# Patient Record
Sex: Male | Born: 1995 | Race: Black or African American | Hispanic: No | Marital: Single | Smoking: Never smoker
Health system: Southern US, Community
[De-identification: ages and names within clinical notes are randomized; demographics above are authoritative.]

## PROBLEM LIST (undated history)

## (undated) HISTORY — PX: HERNIA REPAIR: SHX51

---

## 2019-07-13 ENCOUNTER — Ambulatory Visit
Admission: EM | Admit: 2019-07-13 | Discharge: 2019-07-13 | Disposition: A | Discharge status: Home / Self Care | Payer: Self-pay | Attending: Emergency Medicine | Admitting: Emergency Medicine

## 2019-07-13 ENCOUNTER — Other Ambulatory Visit: Payer: Self-pay

## 2019-07-13 ENCOUNTER — Ambulatory Visit (INDEPENDENT_AMBULATORY_CARE_PROVIDER_SITE_OTHER): Payer: Self-pay

## 2019-07-13 DIAGNOSIS — S61239A Puncture wound without foreign body of unspecified finger without damage to nail, initial encounter: Secondary | ICD-10-CM

## 2019-07-13 DIAGNOSIS — W540XXA Bitten by dog, initial encounter: Secondary | ICD-10-CM

## 2019-07-13 DIAGNOSIS — M79642 Pain in left hand: Secondary | ICD-10-CM

## 2019-07-13 DIAGNOSIS — Z23 Encounter for immunization: Secondary | ICD-10-CM

## 2019-07-13 MED ORDER — AMOXICILLIN-POT CLAVULANATE 875-125 MG PO TABS: 1.0000 | ORAL_TABLET | Freq: Two times a day (BID) | ORAL | 0 refills | Status: AC

## 2019-07-13 MED ORDER — TETANUS-DIPHTH-ACELL PERTUSSIS 5-2.5-18.5 LF-MCG/0.5 IM SUSP
0.5000 mL | Freq: Once | INTRAMUSCULAR | Status: AC
  Administered 2019-07-13: 0.5 mL via INTRAMUSCULAR

## 2019-07-13 NOTE — Discharge Instructions (Addendum)
Keep skin clean and dry.  Return for worsening pain, swelling, discharge, warmth, difficulty moving fingers, fever. If any concern the dog may have rabies, please go to ER for treatment.

## 2019-07-13 NOTE — ED Provider Notes (Signed)
Ryan Pruitt    CSN: [id number] Arrival date & time: 07/13/19  1800      History   Chief Complaint Chief Complaint  Patient presents with  . Animal Bite    HPI Ryan Pruitt. is a 24 y.o. male presenting for dog bite of left hand.  States this occurred earlier today: Bit by customer's boxer.  States dogs rabies shots are up-to-date.  Bleeding controlled on arrival.  Last tetanus unknown.      History reviewed. No pertinent past medical history.  There are no problems to display for this patient.   Past Surgical History:  Procedure Laterality Date  . HERNIA REPAIR         Home Medications    Prior to Admission medications   Medication Sig Start Date End Date Taking? Authorizing Provider  amoxicillin-clavulanate (AUGMENTIN) 875-125 MG tablet Take 1 tablet by mouth every 12 (twelve) hours for 5 days. 07/13/19 07/18/19  Hall-Potvin, [place], PA-C    Family History History reviewed. No pertinent family history.  Social History Social History   Tobacco Use  . Smoking status: Never Smoker  . Smokeless tobacco: Never Used  Substance Use Topics  . Alcohol use: Not on file  . Drug use: Not on file     Allergies   Patient has no known allergies.   Review of Systems As per HPI   Physical Exam Triage Vital Signs ED Triage Vitals  Enc Vitals Group     BP      Pulse      Resp      Temp      Temp src      SpO2      Weight      Height      Head Circumference      Peak Flow      Pain Score      Pain Loc      Pain Edu?      Excl. in GC?    No data found.  Updated Vital Signs BP (!) 144/86   Pulse 67   Temp 98 F (36.7 C)   Resp 16   SpO2 99%   Visual Acuity Right Eye Distance:   Left Eye Distance:   Bilateral Distance:    Right Eye Near:   Left Eye Near:    Bilateral Near:     Physical Exam Constitutional:      General: He is not in acute distress. HENT:     Head: Normocephalic and atraumatic.  Eyes:     General: No  scleral icterus.    Pupils: Pupils are equal, round, and reactive to light.  Cardiovascular:     Rate and Rhythm: Normal rate.  Pulmonary:     Effort: Pulmonary effort is normal. No respiratory distress.     Breath sounds: No wheezing.  Musculoskeletal:        General: Tenderness present. No swelling or deformity. Normal range of motion.  Skin:    Coloration: Skin is not jaundiced or pale.     Comments: Multiple, small punctate wounds on third, fourth, fifth digits.  Largest is fifth digit: 1.5 cm superficial laceration without foreign body.  Mild TTP.  Does not involve nail bed.  Neurological:     Mental Status: He is alert and oriented to person, place, and time.      UC Treatments / Results  Labs (all labs ordered are listed, but only abnormal results are displayed) Labs Reviewed -  No data to display  EKG   Radiology DG Hand Complete Left  Result Date: 07/13/2019 CLINICAL DATA:  Dog bite. EXAM: LEFT HAND - COMPLETE 3+ VIEW COMPARISON:  None. FINDINGS: There is no evidence of fracture or dislocation. There is no evidence of arthropathy or other focal bone abnormality. Soft tissues are unremarkable. IMPRESSION: Negative. Electronically Signed   By: Constance Holster M.D.   On: 07/13/2019 18:36    Procedures Laceration Repair  Date/Time: 07/13/2019 7:04 PM Performed by: Quincy Sheehan, PA-C Authorized by: Quincy Sheehan, PA-C   Consent:    Consent obtained:  Verbal   Consent given by:  Patient   Risks discussed:  Infection, need for additional repair, pain, poor cosmetic result and poor wound healing   Alternatives discussed:  No treatment and delayed treatment Universal protocol:    Patient identity confirmed:  Verbally with patient Laceration details:    Location:  Finger   Finger location:  L small finger   Length (cm):  1.5   Depth (mm):  2 Repair type:    Repair type:  Simple Pre-procedure details:    Preparation:  Patient was prepped and draped  in usual sterile fashion Exploration:    Hemostasis achieved with:  Direct pressure   Wound exploration: wound explored through full range of motion     Contaminated: no   Treatment:    Area cleansed with:  Saline   Amount of cleaning:  Standard   Irrigation solution:  Sterile saline   Irrigation method:  Pressure wash Skin repair:    Repair method:  Steri-Strips   Number of Steri-Strips:  3 Approximation:    Approximation:  Close Post-procedure details:    Dressing:  Non-adherent dressing   Patient tolerance of procedure:  Tolerated well, no immediate complications   (including critical Pruitt time)  Medications Ordered in UC Medications  Tdap (BOOSTRIX) injection 0.5 mL (0.5 mLs Intramuscular Given 07/13/19 1817)    Initial Impression / Assessment and Plan / UC Course  I have reviewed the triage vital signs and the nursing notes.  Pertinent labs & imaging results that were available during my Pruitt of the patient were reviewed by me and considered in my medical decision making (see chart for details).     Tetanus updated in office today. Left hand x-ray done office, reviewed by me radiology: Negative for soft tissue abnormality or fracture.  Reviewed findings with patient verbalized understanding. Wound thoroughly irrigated, and 3 Steri-Strips applied.  Patient tolerated this well. Given dog bite/puncture wounds, will start Augmentin today.  Return precautions discussed, patient verbalized understanding and is agreeable to plan. Final Clinical Impressions(s) / UC Diagnoses   Final diagnoses:  Dog bite, initial encounter  Puncture wound of finger of left hand, initial encounter     Discharge Instructions     Keep skin clean and dry.  Return for worsening pain, swelling, discharge, warmth, difficulty moving fingers, fever. If any concern the dog may have rabies, please go to ER for treatment.    ED Prescriptions    Medication Sig Dispense Auth. Provider    amoxicillin-clavulanate (AUGMENTIN) 875-125 MG tablet Take 1 tablet by mouth every 12 (twelve) hours for 5 days. 10 tablet Hall-Potvin, [place], PA-C     PDMP not reviewed this encounter.   Ryan Pruitt, [place] 07/13/19 1906

## 2019-07-13 NOTE — ED Triage Notes (Addendum)
Pt c/o dog bite to left hand, [employer] customer's dog, pt states he did see dog's vaccination history and states rabies UTD. Unknown tetanus status. Multiple lacs to fingers, bleeding controlled with bandaids PTA.

## 2021-01-02 IMAGING — DX DG HAND COMPLETE 3+V*L*
3 series · 3 of 3 positions shown · non-contrast
Comparison: None.

CLINICAL DATA: Dog bite.

EXAM:
LEFT HAND - COMPLETE 3+ VIEW

[hand pa]
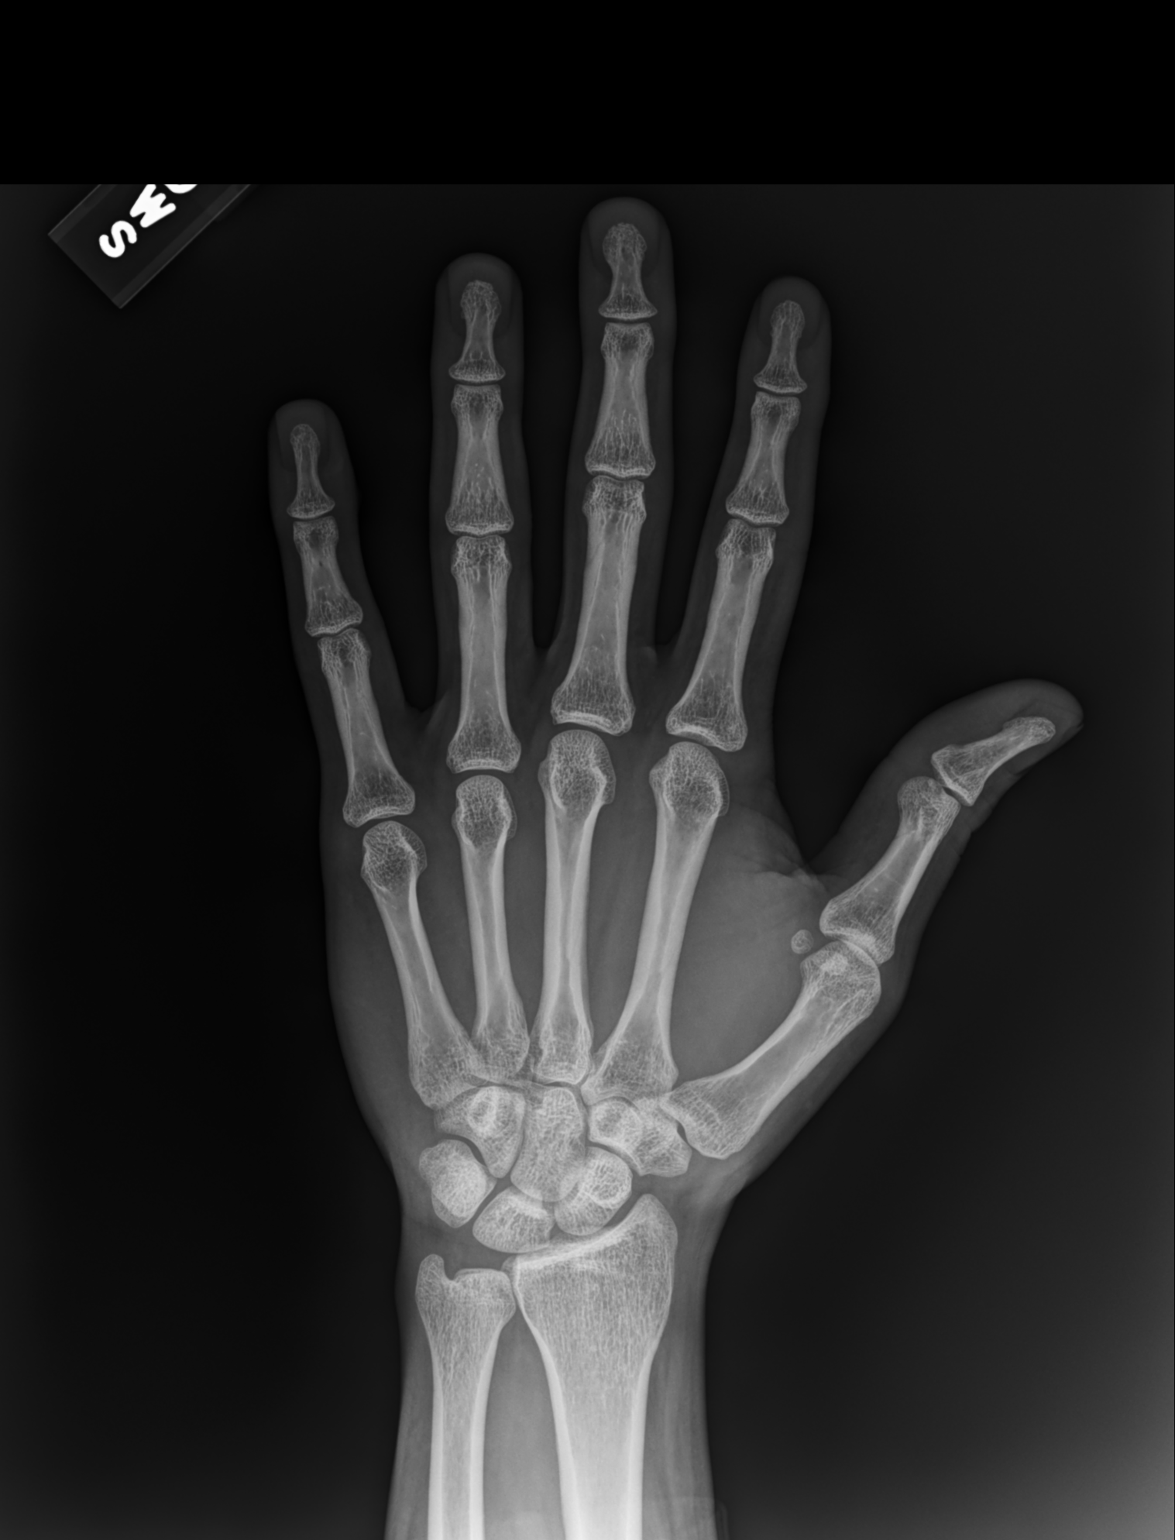

[hand mlo]
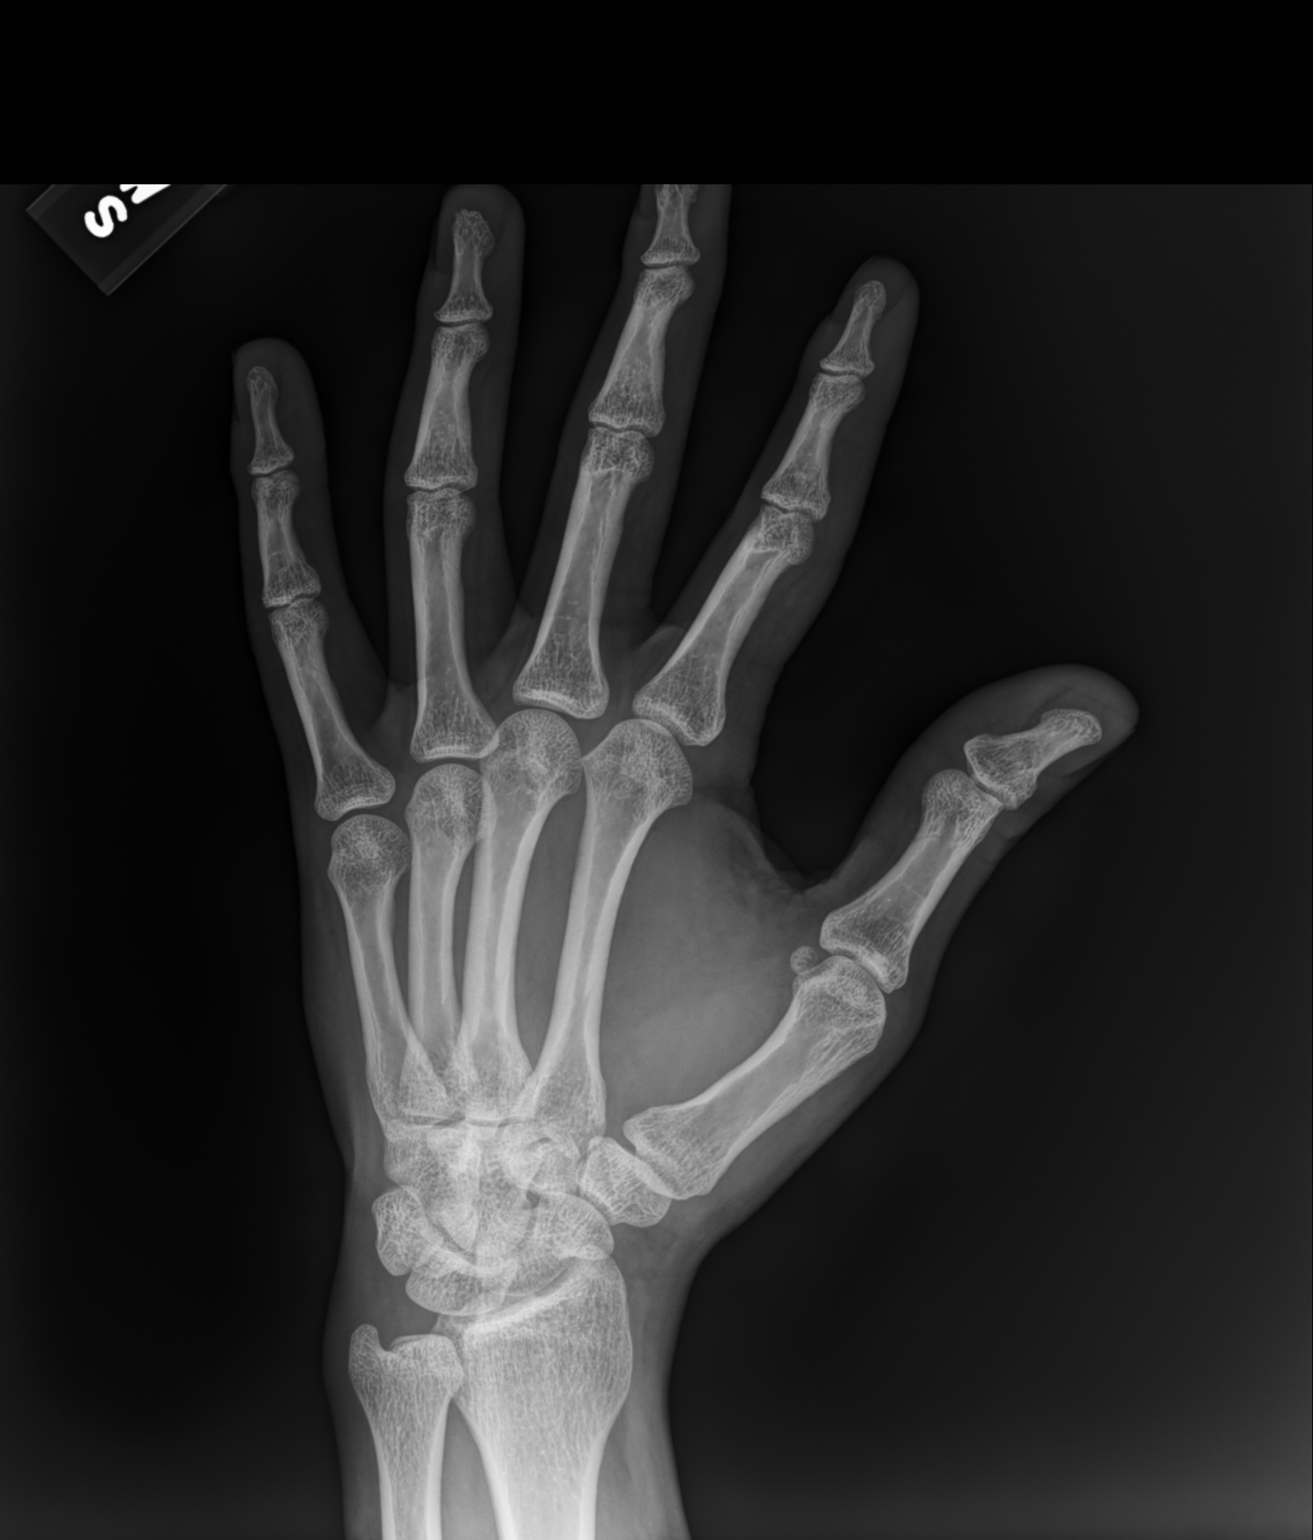

[hand lat]
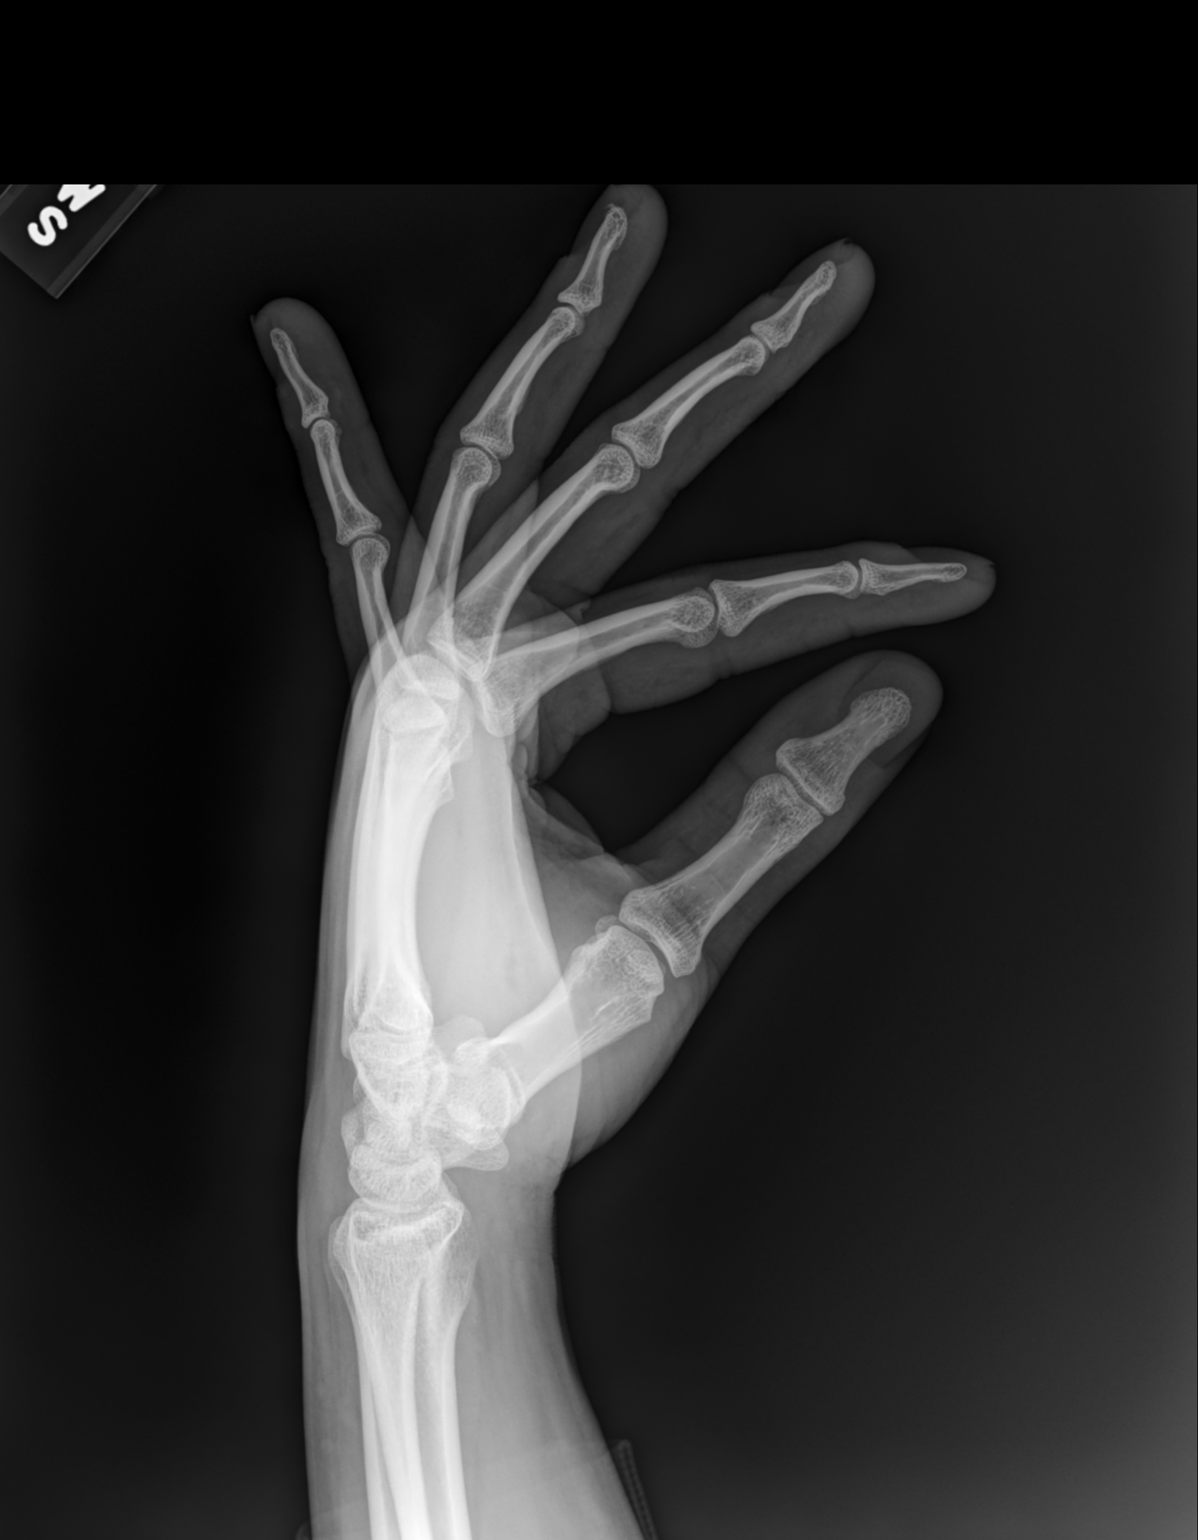

[3 of 3 positions shown; findings below may reference images not displayed]

FINDINGS: There is no evidence of fracture or dislocation. There is no
evidence of arthropathy or other focal bone abnormality. Soft
tissues are unremarkable.
IMPRESSION: Negative.

## 2021-12-07 ENCOUNTER — Emergency Department
Admission: EM | Admit: 2021-12-07 | Discharge: 2021-12-07 | Disposition: A | Discharge status: Home / Self Care | Payer: Medicaid Other | Attending: Emergency Medicine | Admitting: Emergency Medicine

## 2021-12-07 ENCOUNTER — Encounter: Payer: Self-pay | Admitting: Emergency Medicine

## 2021-12-07 ENCOUNTER — Other Ambulatory Visit: Payer: Self-pay

## 2021-12-07 DIAGNOSIS — K047 Periapical abscess without sinus: Secondary | ICD-10-CM

## 2021-12-07 MED ORDER — HYDROCODONE-ACETAMINOPHEN 5-325 MG PO TABS: 1.0000 | ORAL_TABLET | ORAL | 0 refills | Status: DC | PRN

## 2021-12-07 MED ORDER — AMOXICILLIN 500 MG PO CAPS
1000.0000 mg | ORAL_CAPSULE | Freq: Once | ORAL | Status: AC
  Administered 2021-12-07: 1000 mg via ORAL
  Filled 2021-12-07: qty 2

## 2021-12-07 MED ORDER — AMOXICILLIN 875 MG PO TABS: 875.0000 mg | ORAL_TABLET | Freq: Two times a day (BID) | ORAL | 0 refills | Status: AC

## 2021-12-07 NOTE — ED Provider Notes (Signed)
[facility] Provider Note  Patient Contact: 6:32 PM (approximate)   History   Dental Pain   HPI  Ryan Pruitt. is a 26 y.o. male who presents the emergency department complaining of dental pain.  Patient states that he has a wisdom tooth that is cracked.  This occurs on the left lower dentition.  No reported facial edema.  No fevers or chills, difficulty breathing or swallowing.  No medications prior to arrival.  Patient states that he currently does not have a dentist as he currently does not have dental insurance.     Physical Exam   Triage Vital Signs: ED Triage Vitals  Enc Vitals Group     BP 12/07/21 1659 (!) 121/90     Pulse Rate 12/07/21 1659 70     Resp 12/07/21 1659 20     Temp 12/07/21 1659 98.7 F (37.1 C)     Temp Source 12/07/21 1659 Oral     SpO2 12/07/21 1659 98 %     Weight 12/07/21 1612 185 lb (83.9 kg)     Height 12/07/21 1612 5\' 9"  (1.753 m)     Head Circumference --      Peak Flow --      Pain Score 12/07/21 1612 10     Pain Loc --      Pain Edu? --      Excl. in GC? --     Most recent vital signs: Vitals:   12/07/21 1659  BP: (!) 121/90  Pulse: 70  Resp: 20  Temp: 98.7 F (37.1 C)  SpO2: 98%     General: Alert and in no acute distress. ENT:      Ears:       Nose: No congestion/rhinnorhea.      Mouth/Throat: Mucous membranes are moist.  Visualization of his incision reveals no obvious fractures.  Slight edema in the left lower gum without appreciable drainable fluid collection.  No edema in the left cheek or mandible region. Neck: No stridor. No cervical spine tenderness to palpation. Hematological/Lymphatic/Immunilogical: No cervical lymphadenopathy. Cardiovascular:  Good peripheral perfusion Respiratory: Normal respiratory effort without tachypnea or retractions. Lungs CTAB.  Musculoskeletal: Full range of motion to all extremities.  Neurologic:  No gross focal neurologic deficits are appreciated.  Skin:    No rash noted Other:   ED Results / Procedures / Treatments   Labs (all labs ordered are listed, but only abnormal results are displayed) Labs Reviewed - No data to display   EKG     RADIOLOGY    No results found.  PROCEDURES:  Critical Care performed: No  Procedures   MEDICATIONS ORDERED IN ED: Medications - No data to display   IMPRESSION / MDM / ASSESSMENT AND PLAN / ED COURSE  I reviewed the triage vital signs and the nursing notes.                              Differential diagnosis includes, but is not limited to, dental infection, broken dentition, facial abscess, sialoadenitis, parotitis  Patient's presentation is most consistent with acute presentation with potential threat to life or bodily function.   Patient's diagnosis is consistent with dental infection.  Patient presented to the emergency department with dental pain.  He has a cracked wisdom tooth the left lower dentition.  Suspect apical abscess.  There is no drainable fluid collection in the mouth.  No facial edema.  No  concerns for deep space infection.  No indication for labs or imaging currently..  Patient will be prescribed antibiotics and limited pain medication.  Follow-up with dental options provided to the patient.  Patient is given ED precautions to return to the ED for any worsening or new symptoms.        FINAL CLINICAL IMPRESSION(S) / ED DIAGNOSES   Final diagnoses:  Dental infection     Rx / DC Orders   ED Discharge Orders          Ordered    HYDROcodone-acetaminophen (NORCO/VICODIN) 5-325 MG tablet  Every 4 hours PRN        12/07/21 2007    amoxicillin (AMOXIL) 875 MG tablet  2 times daily        12/07/21 2007             Note:  This document was prepared using Dragon voice recognition software and may include unintentional dictation errors.   Brynda Peon 12/07/21 2008    Vanessa Pulaski, MD 12/07/21 [phone]

## 2021-12-07 NOTE — ED Triage Notes (Signed)
Pt states wisdom tooth on left side cracked 3 days ago and pain is on left side of mouth

## 2021-12-07 NOTE — ED Triage Notes (Signed)
C/O left upper wisdom tooth pain x 1 day.

## 2021-12-07 NOTE — ED Notes (Signed)
Pt signed esignature  d/c inst to pt.   

## 2021-12-07 NOTE — Discharge Instructions (Addendum)
OPTIONS FOR DENTAL FOLLOW UP CARE ° °[place] [facility] and [facility] - Local Safety Net Dental Clinics °http://www.ncdhhs.gov/dph/oralhealth/services/safetynetclinics.htm °  °[facility] (336-562-3123) ° °[facility] [place] (919-933-9087) ° °[facility] ([phone] ext [phone]) ° °[facility] ([phone]) ° °[facility] ([phone]) °This clinic caters to the indigent population and is on a lottery system. °Location: °[facility], [address], [place] °Clinic Hours: °Wednesdays from 6pm - 9pm, patients seen by a lottery system. °For dates, call or go to www.med.[facility].edu/[facility]/patients/Dental-[facility] °Services: °Cleanings, fillings and simple extractions. °Payment Options: °DENTAL WORK IS FREE OF CHARGE. Bring proof of income or support. °Best way to get seen: °Arrive at 5:15 pm - this is a lottery, NOT first come/first serve, so arriving earlier will not increase your chances of being seen. °  °  °[facility] °[phone] °Select option 1 for emergencies °  °Location: °[facility], [address], [place] °Clinic Hours: °No walk-ins accepted - call the day before to schedule an appointment. °Check in times are 9:30 am and 1:30 pm. °Services: °Simple extractions, temporary fillings, pulpectomy/pulp debridement, uncomplicated abscess drainage. °Payment Options: °PAYMENT IS DUE AT THE TIME OF SERVICE.  Fee is usually $100-200, additional surgical procedures (e.g. abscess drainage) may be extra. °Cash, checks, Visa/MasterCard accepted.  Can file Medicaid if patient is covered for dental - patient should call case worker to check. °No discount for [facility] patients. °Best way to get seen: °MUST call the day before and get onto the schedule. Can usually be seen the next 1-2 days. No walk-ins accepted. °  °  °[facility] °[phone] °   °Location: °[facility], [address], [place] °Clinic Hours: °M, W, Th, F 8am or 1:30pm, Tues 9a or 1:30 - first come/first served. °Services: °Simple extractions, temporary fillings, uncomplicated abscess drainage.  You do not need to be an [place] resident. °Payment Options: °PAYMENT IS DUE AT THE TIME OF SERVICE. °Dental insurance, otherwise sliding scale - bring proof of income or support. °Depending on income and treatment needed, cost is usually $50-200. °Best way to get seen: °Arrive early as it is first come/first served. °  °  °[facility] °[phone] °  °Location: °[address] °Clinic Hours: °Mon-Thu 8a-5p °Services: °Most basic dental services including extractions and fillings. °Payment Options: °PAYMENT IS DUE AT THE TIME OF SERVICE. °Sliding scale, up to 50% off - bring proof if income or support. °Medicaid with dental option accepted. °Best way to get seen: °Call to schedule an appointment, can usually be seen within 2 weeks OR they will try to see walk-ins - show up at 8a or 2p (you may have to wait). °  °  °[facility] °919-245-2435 °[place] RESIDENTS ONLY °  °Location: °[facility], [address], [place], [place] [address] °Clinic Hours: By appointment only. °Monday - Thursday 8am-5pm, Friday 8am-12pm °Services: Cleanings, fillings, extractions. °Payment Options: °PAYMENT IS DUE AT THE TIME OF SERVICE. °Cash, Visa or MasterCard. Sliding scale - $30 minimum per service. °Best way to get seen: °Come in to office, complete packet and make an appointment - need proof of income °or support monies for each household member and proof of [place] residence. °Usually takes about a month to get in. °  °  °[facility] °[phone] °  °Location: °[address]   [place] °Clinic Hours: Walk-in Urgent Care Dental Services are offered Monday-Friday  mornings only. °The numbers of emergencies accepted daily is limited to the number of °providers available. °Maximum 15 - Mondays, Wednesdays & Thursdays °Maximum 10 - Tuesdays & Fridays °Services: °You do not need to be a [place] resident to be seen for a dental emergency. °Emergencies are defined as pain, swelling, abnormal bleeding, or dental trauma. Walkins will receive x-rays if needed. °NOTE: Dental cleaning is not an emergency. °Payment Options: °PAYMENT IS DUE AT THE TIME OF SERVICE. °Minimum co-pay is $40.00 for uninsured patients. °Minimum co-pay is $3.00 for Medicaid with dental coverage. °Dental Insurance is accepted and must be presented at time of visit. °Medicare does not cover dental. °Forms of payment: Cash, credit card, checks. °Best way to get seen: °If not previously registered with the clinic, walk-in dental registration begins at 7:15 am and is on a first come/first serve basis. °If previously registered with the clinic, call to make an appointment. °  °  °[facility] °[phone] °[place] RESIDENTS ONLY °  °Location: °[address], [place], [place] °Clinic Hours: °Mon-Thu 10a-2p °Services: Extractions only! °Payment Options: °FREE (donations accepted) - bring proof of income or support °Best way to get seen: °Call and schedule an appointment OR come at 8am on the 1st Monday of every month (except for holidays) when it is first come/first served. °  °  °[facility] °[phone] °  °Location: °[address], [place] °Clinic Hours: °Friday mornings °Services, Payment Options, Best way to get seen: °Call for info °

## 2021-12-08 ENCOUNTER — Telehealth: Payer: Self-pay | Admitting: Emergency Medicine

## 2021-12-08 MED ORDER — HYDROCODONE-ACETAMINOPHEN 5-325 MG PO TABS: 1.0000 | ORAL_TABLET | ORAL | 0 refills | Status: AC | PRN

## 2021-12-08 NOTE — Telephone Encounter (Signed)
Pharmacy did not get the electronic Norco prescription.  I have recently electronic Norco prescription.
